# Patient Record
Sex: Male | Born: 1955 | Race: Black or African American | Hispanic: No | Smoking: Current every day smoker
Health system: Southern US, Community
[De-identification: ages and names within clinical notes are randomized; demographics above are authoritative.]

## PROBLEM LIST (undated history)

## (undated) DIAGNOSIS — E119 Type 2 diabetes mellitus without complications: Secondary | ICD-10-CM

## (undated) DIAGNOSIS — I10 Essential (primary) hypertension: Secondary | ICD-10-CM

---

## 2006-08-12 ENCOUNTER — Inpatient Hospital Stay (HOSPITAL_COMMUNITY): Admission: EM | Admit: 2006-08-12 | Discharge: 2006-08-24 | Payer: Self-pay | Admitting: Emergency Medicine

## 2006-09-30 ENCOUNTER — Emergency Department (HOSPITAL_COMMUNITY): Admission: EM | Admit: 2006-09-30 | Discharge: 2006-09-30 | Payer: Self-pay | Admitting: Emergency Medicine

## 2007-11-04 IMAGING — CR DG CHEST 1V PORT
1 series · 1 of 1 positions shown · non-contrast
Comparison: 08/19/06 ? [DATE].

CLINICAL DATA: Followup pneumothorax.  
PORTABLE CHEST ? 1 VIEW ? 08/19/06 ? [DATE]:

[view not recorded]
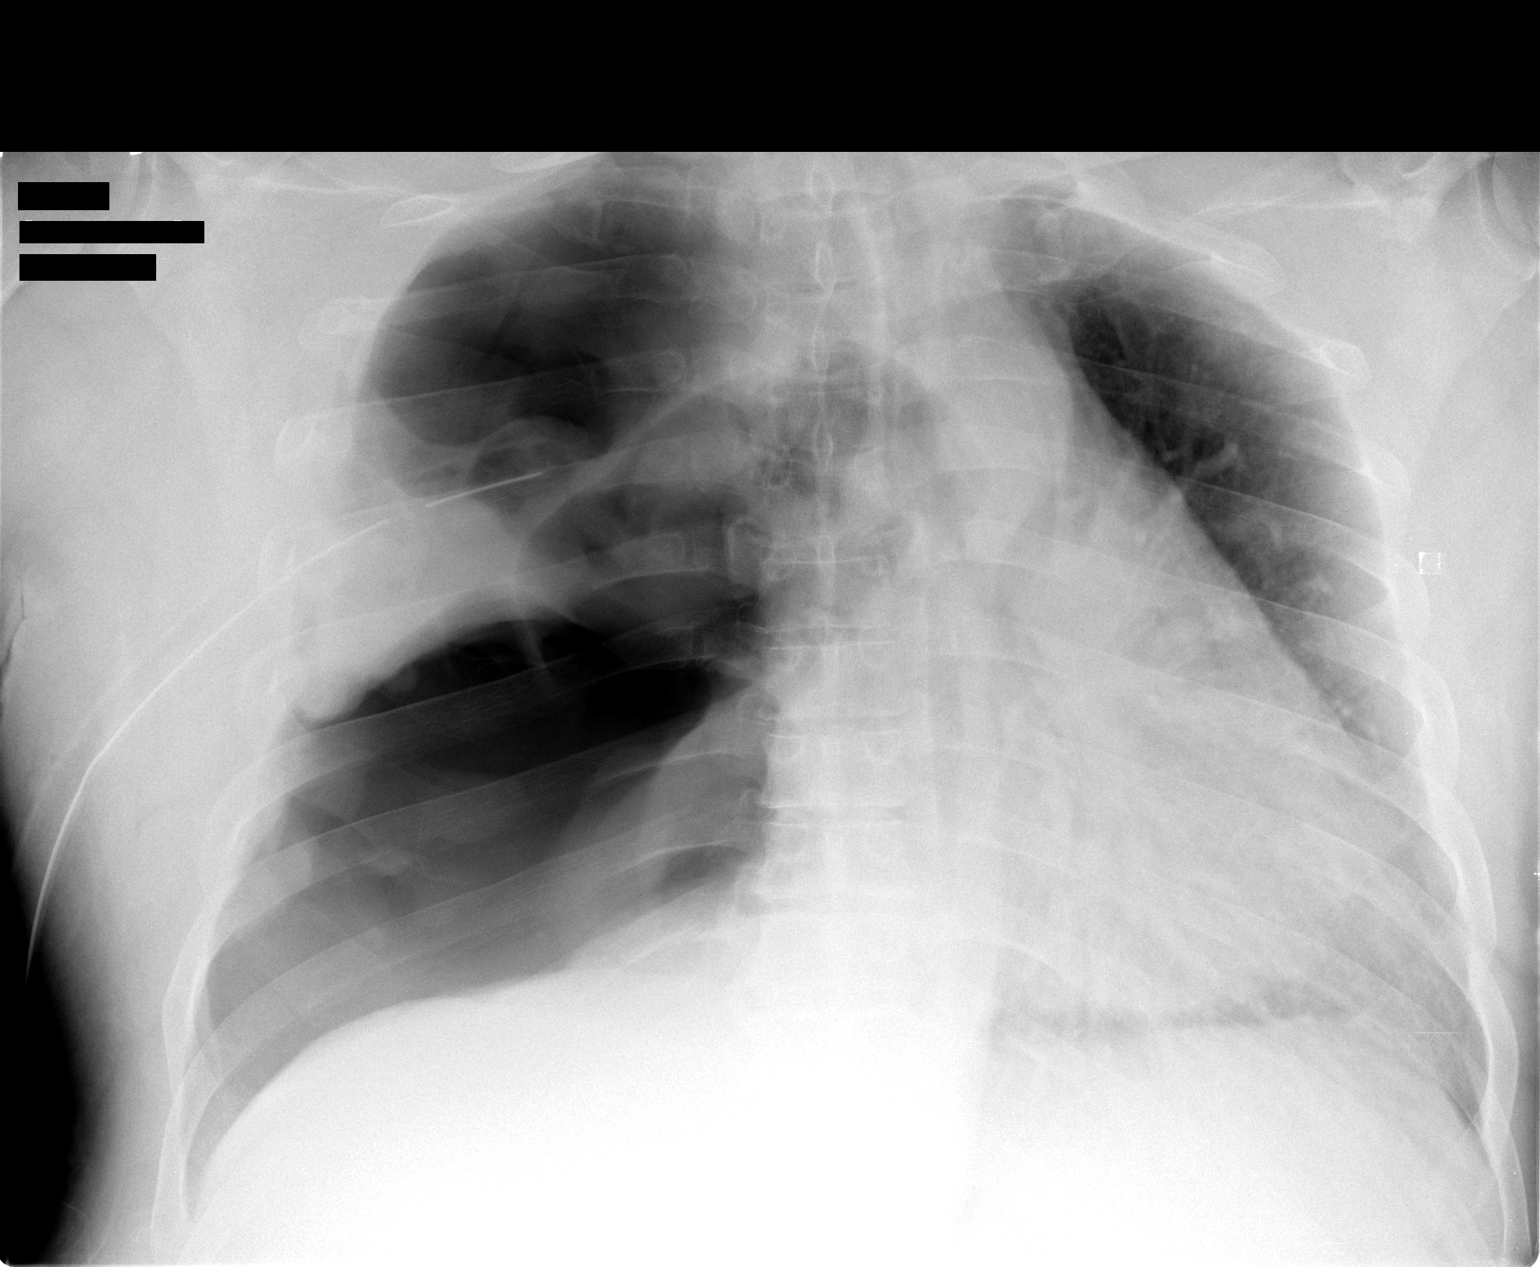

[1 of 1 positions shown; findings below may reference images not displayed]

FINDINGS: Chest tube appears slightly superiorly located and may have been adjusted.  However, there remains a large right-sided pneumothorax which I suspect is under tension, as indicated by mediastinal shift toward the left.  Right lung is completely collapsed.  Possibility of air leak with one-way valve involving the bronchus may contribute to this appearance.  CT imaging may be considered.  esults called by to Dr. Joclo.
IMPRESSION: Persistent right-sided pneumothorax which is suspicious for a tension pneumothorax.  This will require followup as noted above.

## 2007-11-04 IMAGING — CR DG CHEST 1V PORT
1 series · 1 of 1 positions shown · non-contrast
Comparison: 08/18/06.

CLINICAL DATA: Rib fractures.  Chest tube in place; placed on water seal.  
PORTABLE CHEST - 1 VIEW, 3784 HOURS:

[view not recorded]
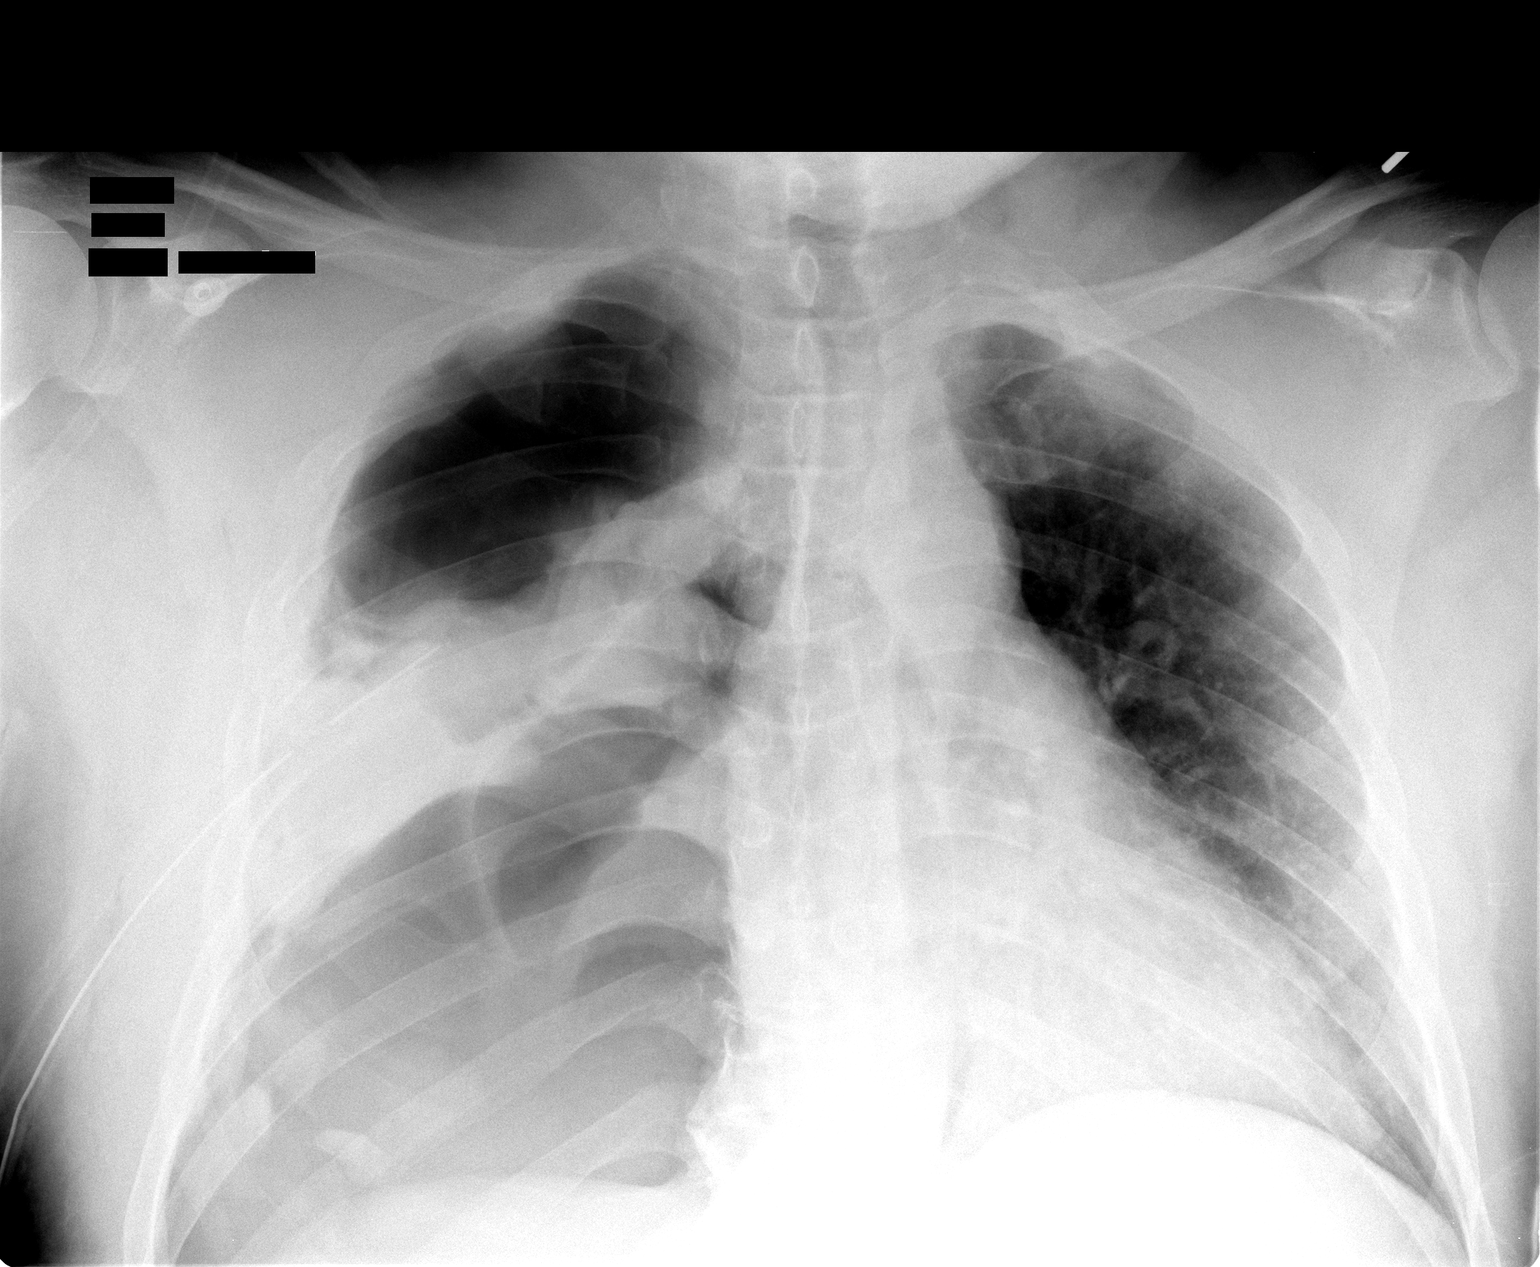

[1 of 1 positions shown; findings below may reference images not displayed]

FINDINGS: Interval development of large right-sided pneumothorax.  I suspect that there is a component that is under tension as indicated by depression of the right hemidiaphragm, shift of the mediastinal structures towards the left and slight shift of the trachea towards the left.  Consolidation mid right lung may represent collapsed lung/hematoma/pleural fluid.  Multiple right rib fractures.  Results discussed with Dr. Berline.  Pulmonary vascular congestion on the left may represent shunting of vasculature.
IMPRESSION: 1.  Interval development of large right-sided pneumothorax, which may have a component which is under tension as described above.  A single right chest tube remains in place.  
2.  Right PICC line has been removed.  Please see above.

## 2015-03-24 ENCOUNTER — Ambulatory Visit (INDEPENDENT_AMBULATORY_CARE_PROVIDER_SITE_OTHER): Payer: Self-pay | Admitting: Emergency Medicine

## 2015-03-24 VITALS — BP 122/88 | HR 87 | Temp 98.6°F | Resp 18 | Ht 69.5 in | Wt 223.0 lb

## 2015-03-24 DIAGNOSIS — I10 Essential (primary) hypertension: Secondary | ICD-10-CM

## 2015-03-24 DIAGNOSIS — E119 Type 2 diabetes mellitus without complications: Secondary | ICD-10-CM | POA: Insufficient documentation

## 2015-03-24 DIAGNOSIS — Z021 Encounter for pre-employment examination: Secondary | ICD-10-CM

## 2015-03-24 DIAGNOSIS — Z024 Encounter for examination for driving license: Secondary | ICD-10-CM

## 2015-03-24 LAB — POCT GLYCOSYLATED HEMOGLOBIN (HGB A1C): Hemoglobin A1C: 6.4

## 2015-03-24 NOTE — Patient Instructions (Signed)

## 2015-03-24 NOTE — Progress Notes (Signed)
Subjective:  Patient ID: Andrew Browning, male    DOB: 04/05/1956  Age: 59 y.o. MRN: [id number]  CC: Annual Exam   HPI Andrew Browning presents  for DOT examination. Does have a history diabetes  History Andrew Browning has no past medical history on file.   He has no past surgical history on file.   His  family history is not on file.  He   reports that he has been smoking.  He does not have any smokeless tobacco history on file. His alcohol and drug histories are not on file.  No outpatient prescriptions prior to visit.   No facility-administered medications prior to visit.    History   Social History  . Marital Status: Widowed    Spouse Name: N/A  . Number of Children: N/A  . Years of Education: N/A   Social History Main Topics  . Smoking status: Current Every Day Smoker  . Smokeless tobacco: Not on file  . Alcohol Use: Not on file  . Drug Use: Not on file  . Sexual Activity: Not on file   Other Topics Concern  . None   Social History Narrative  . None     Review of Systems  Constitutional: Negative for fever, chills and appetite change.  HENT: Negative for congestion, ear pain, postnasal drip, sinus pressure and sore throat.   Eyes: Negative for pain and redness.  Respiratory: Negative for cough, shortness of breath and wheezing.   Cardiovascular: Negative for leg swelling.  Gastrointestinal: Negative for nausea, vomiting, abdominal pain, diarrhea, constipation and blood in stool.  Endocrine: Negative for polyuria.  Genitourinary: Negative for dysuria, urgency, frequency and flank pain.  Musculoskeletal: Negative for gait problem.  Skin: Negative for rash.  Neurological: Negative for weakness and headaches.  Psychiatric/Behavioral: Negative for confusion and decreased concentration. The patient is not nervous/anxious.     Objective:  BP 122/88 mmHg  Pulse 87  Temp(Src) 98.6 F (37 C)  Resp 18  Ht 5' 9.5" (1.765 m)  Wt 223 lb (101.152 kg)  BMI  32.47 kg/m2  SpO2 98%  Physical Exam  Constitutional: He is oriented to person, place, and time. He appears well-developed and well-nourished. No distress.  HENT:  Head: Normocephalic and atraumatic.  Right Ear: External ear normal.  Left Ear: External ear normal.  Nose: Nose normal.  Eyes: Conjunctivae and EOM are normal. Pupils are equal, round, and reactive to light. No scleral icterus.  Neck: Normal range of motion. Neck supple. No tracheal deviation present.  Cardiovascular: Normal rate, regular rhythm and normal heart sounds.   Pulmonary/Chest: Effort normal. No respiratory distress. He has no wheezes. He has no rales.  Abdominal: He exhibits no mass. There is no tenderness. There is no rebound and no guarding.  Musculoskeletal: He exhibits no edema.  Lymphadenopathy:    He has no cervical adenopathy.  Neurological: He is alert and oriented to person, place, and time. Coordination normal.  Skin: Skin is warm and dry. No rash noted.  Psychiatric: He has a normal mood and affect. His behavior is normal.      Assessment & Plan:   Andrew Browning was seen today for annual exam.  Diagnoses and all orders for this visit:  Encounter for commercial driver medical examination (CDME)  Diabetes mellitus without complication Orders: -     POCT glycosylated hemoglobin (Hb A1C)  Essential hypertension   I am having [name] maintain his METFORMIN HCL PO and lisinopril.  Meds ordered this encounter  Medications  .  METFORMIN HCL PO    Sig: Take by mouth.  Marland Kitchen lisinopril (PRINIVIL,ZESTRIL) 5 MG tablet    Sig: Take 5 mg by mouth daily.   DOT card was reviewed and signed for one year.   Appropriate red flag conditions were discussed with the patient as well as actions that should be taken.  Patient expressed his understanding.  Follow-up: Return if symptoms worsen or fail to improve.  Carmelina Dane, MD   Results for orders placed or performed in visit on 03/24/15  POCT  glycosylated hemoglobin (Hb A1C)  Result Value Ref Range   Hemoglobin A1C 6.4

## 2017-06-25 ENCOUNTER — Encounter (HOSPITAL_COMMUNITY): Payer: Self-pay | Admitting: Emergency Medicine

## 2017-06-25 ENCOUNTER — Emergency Department (HOSPITAL_COMMUNITY): Payer: PRIVATE HEALTH INSURANCE

## 2017-06-25 ENCOUNTER — Emergency Department (HOSPITAL_COMMUNITY)
Admission: EM | Admit: 2017-06-25 | Discharge: 2017-06-25 | Disposition: A | Discharge status: Home / Self Care | Payer: PRIVATE HEALTH INSURANCE | Attending: Emergency Medicine | Admitting: Emergency Medicine

## 2017-06-25 DIAGNOSIS — R070 Pain in throat: Secondary | ICD-10-CM | POA: Diagnosis present

## 2017-06-25 DIAGNOSIS — F17200 Nicotine dependence, unspecified, uncomplicated: Secondary | ICD-10-CM | POA: Diagnosis not present

## 2017-06-25 DIAGNOSIS — Z7984 Long term (current) use of oral hypoglycemic drugs: Secondary | ICD-10-CM | POA: Diagnosis not present

## 2017-06-25 DIAGNOSIS — J40 Bronchitis, not specified as acute or chronic: Secondary | ICD-10-CM | POA: Insufficient documentation

## 2017-06-25 DIAGNOSIS — R197 Diarrhea, unspecified: Secondary | ICD-10-CM | POA: Insufficient documentation

## 2017-06-25 DIAGNOSIS — I10 Essential (primary) hypertension: Secondary | ICD-10-CM | POA: Insufficient documentation

## 2017-06-25 DIAGNOSIS — E119 Type 2 diabetes mellitus without complications: Secondary | ICD-10-CM | POA: Insufficient documentation

## 2017-06-25 DIAGNOSIS — Z79899 Other long term (current) drug therapy: Secondary | ICD-10-CM | POA: Insufficient documentation

## 2017-06-25 HISTORY — DX: Type 2 diabetes mellitus without complications: E11.9

## 2017-06-25 HISTORY — DX: Essential (primary) hypertension: I10

## 2017-06-25 LAB — RAPID STREP SCREEN (MED CTR MEBANE ONLY): STREPTOCOCCUS, GROUP A SCREEN (DIRECT): NEGATIVE

## 2017-06-25 MED ORDER — AZITHROMYCIN 250 MG PO TABS: 250.0000 mg | ORAL_TABLET | Freq: Every day | ORAL | 0 refills | Status: AC

## 2017-06-25 NOTE — ED Triage Notes (Signed)
Patient here with two day history of sore throat.  He has been breaking out in sweats off and on in the last few days also.  Patient having trouble swallowing, some shortness of breath.  Patient states that he has not been checking his sugar.

## 2017-06-25 NOTE — ED Provider Notes (Signed)
[facility]-EMERGENCY DEPT Provider Note   CSN: [id number] Arrival date & time: 06/25/17  0021     History   Chief Complaint Chief Complaint  Patient presents with  . Sore Throat    HPI Andrew Browning is a 61 y.o. male.  Patient reports that he has been experiencing fever, chills, sweats several days. He started with a sore throat which continues, but now he also has developed a cough. He has had diarrhea, no nausea or vomiting. There is no associated abdominal pain. He has not had shortness of breath.      Past Medical History:  Diagnosis Date  . Diabetes mellitus without complication (HCC)   . Hypertension     Patient Active Problem List   Diagnosis Date Noted  . Diabetes mellitus without complication (HCC) 03/24/2015  . Essential hypertension 03/24/2015    History reviewed. No pertinent surgical history.     Home Medications    Prior to Admission medications   Medication Sig Start Date End Date Taking? Authorizing Provider  lisinopril (PRINIVIL,ZESTRIL) 5 MG tablet Take 5 mg by mouth daily.   Yes [provider]  metFORMIN (GLUCOPHAGE) 500 MG tablet Take 500 mg by mouth daily with breakfast.   Yes [provider]  azithromycin (ZITHROMAX) 250 MG tablet Take 1 tablet (250 mg total) by mouth daily. Take first 2 tablets together, then 1 every day until finished. 06/25/17   Gilda Crease, MD    Family History No family history on file.  Social History Social History  Substance Use Topics  . Smoking status: Current Every Day Smoker  . Smokeless tobacco: Not on file  . Alcohol use Not on file     Allergies   Patient has no known allergies.   Review of Systems Review of Systems  Constitutional: Positive for chills, diaphoresis and fever.  HENT: Positive for sore throat.   Respiratory: Positive for cough.   Gastrointestinal: Positive for diarrhea.  All other systems reviewed and are negative.    Physical Exam Updated Vital  Signs BP (!) 149/102   Pulse 100   Temp 99 F (37.2 C) (Oral)   Resp 18   SpO2 96%   Physical Exam  Constitutional: He is oriented to person, place, and time. He appears well-developed and well-nourished. No distress.  HENT:  Head: Normocephalic and atraumatic.  Right Ear: Hearing normal.  Left Ear: Hearing normal.  Nose: Nose normal.  Mouth/Throat: Mucous membranes are normal. Posterior oropharyngeal erythema present. No oropharyngeal exudate.  Eyes: Pupils are equal, round, and reactive to light. Conjunctivae and EOM are normal.  Neck: Normal range of motion. Neck supple.  Cardiovascular: Regular rhythm, S1 normal and S2 normal.  Exam reveals no gallop and no friction rub.   No murmur heard. Pulmonary/Chest: Effort normal and breath sounds normal. No respiratory distress. He exhibits no tenderness.  Abdominal: Soft. Normal appearance and bowel sounds are normal. There is no hepatosplenomegaly. There is no tenderness. There is no rebound, no guarding, no tenderness at McBurney's point and negative Murphy's sign. No hernia.  Musculoskeletal: Normal range of motion.  Neurological: He is alert and oriented to person, place, and time. He has normal strength. No cranial nerve deficit or sensory deficit. Coordination normal. GCS eye subscore is 4. GCS verbal subscore is 5. GCS motor subscore is 6.  Skin: Skin is warm, dry and intact. No rash noted. No cyanosis.  Psychiatric: He has a normal mood and affect. His speech is normal and behavior is normal.  Thought content normal.  Nursing note and vitals reviewed.    ED Treatments / Results  Labs (all labs ordered are listed, but only abnormal results are displayed) Labs Reviewed  RAPID STREP SCREEN (NOT AT [facility])  CULTURE, GROUP A STREP [facility])    EKG  EKG Interpretation None       Radiology Dg Chest 2 View  Result Date: 06/25/2017 CLINICAL DATA:  Initial evaluation for acute cough, fever. EXAM: CHEST  2 VIEW COMPARISON:  Prior  radiograph from 08/24/2006. FINDINGS: Cardiac and mediastinal silhouettes are within normal limits. Mild elevation the right hemidiaphragm. Blunting of the right costophrenic angle suspected to reflect chronic pleural reaction/scarring, although a small effusion not entirely excluded. Probable associated mild right basilar scarring. No pulmonary edema. No other focal infiltrates. No pneumothorax. No acute osseus abnormality. IMPRESSION: 1. Blunting of the right costophrenic angle, suspected to reflect chronic pleural reaction/scarring, although a tiny effusion could also be considered. 2. Associated mild right basilar atelectasis and/or scarring. 3. No other active cardiopulmonary disease. Electronically Signed   By: Rise Mu M.D.   On: 06/25/2017 04:32    Procedures Procedures (including critical care time)  Medications Ordered in ED Medications - No data to display   Initial Impression / Assessment and Plan / ED Course  I have reviewed the triage vital signs and the nursing notes.  Pertinent labs & imaging results that were available during my care of the patient were reviewed by me and considered in my medical decision making (see chart for details).     Patient has been experiencing fever, chills, sweats, sore throat and cough for several days. He appears well. He has not been experiencing any abdominal pain, abdominal exam is benign. He did have an episode of diarrhea, non-bloody. No nausea or vomiting. Patient does have erythema and slight edema of the oropharynx, no exudate. Rapid strep negative. Culture pending. Patient complaining predominately of cough and chest congestion currently. Chest x-ray does not show obvious pneumonia.  Final Clinical Impressions(s) / ED Diagnoses   Final diagnoses:  Bronchitis    New Prescriptions New Prescriptions   AZITHROMYCIN (ZITHROMAX) 250 MG TABLET    Take 1 tablet (250 mg total) by mouth daily. Take first 2 tablets together, then 1  every day until finished.     Gilda Crease, MD 06/25/17 [phone]

## 2017-06-27 LAB — CULTURE, GROUP A STREP (THRC)

## 2018-10-15 ENCOUNTER — Emergency Department (HOSPITAL_COMMUNITY): Payer: Non-veteran care

## 2018-10-15 ENCOUNTER — Emergency Department (HOSPITAL_COMMUNITY)
Admission: EM | Admit: 2018-10-15 | Discharge: 2018-10-15 | Disposition: A | Discharge status: Home / Self Care | Payer: Non-veteran care | Attending: Emergency Medicine | Admitting: Emergency Medicine

## 2018-10-15 ENCOUNTER — Encounter (HOSPITAL_COMMUNITY): Payer: Self-pay

## 2018-10-15 DIAGNOSIS — Z23 Encounter for immunization: Secondary | ICD-10-CM | POA: Insufficient documentation

## 2018-10-15 DIAGNOSIS — Z7984 Long term (current) use of oral hypoglycemic drugs: Secondary | ICD-10-CM | POA: Insufficient documentation

## 2018-10-15 DIAGNOSIS — W19XXXA Unspecified fall, initial encounter: Secondary | ICD-10-CM | POA: Insufficient documentation

## 2018-10-15 DIAGNOSIS — E119 Type 2 diabetes mellitus without complications: Secondary | ICD-10-CM | POA: Insufficient documentation

## 2018-10-15 DIAGNOSIS — Y929 Unspecified place or not applicable: Secondary | ICD-10-CM | POA: Insufficient documentation

## 2018-10-15 DIAGNOSIS — F17200 Nicotine dependence, unspecified, uncomplicated: Secondary | ICD-10-CM | POA: Insufficient documentation

## 2018-10-15 DIAGNOSIS — Y999 Unspecified external cause status: Secondary | ICD-10-CM | POA: Insufficient documentation

## 2018-10-15 DIAGNOSIS — I10 Essential (primary) hypertension: Secondary | ICD-10-CM | POA: Insufficient documentation

## 2018-10-15 DIAGNOSIS — Z79899 Other long term (current) drug therapy: Secondary | ICD-10-CM | POA: Insufficient documentation

## 2018-10-15 DIAGNOSIS — S0990XA Unspecified injury of head, initial encounter: Secondary | ICD-10-CM

## 2018-10-15 DIAGNOSIS — S0101XA Laceration without foreign body of scalp, initial encounter: Secondary | ICD-10-CM

## 2018-10-15 DIAGNOSIS — Y9389 Activity, other specified: Secondary | ICD-10-CM | POA: Insufficient documentation

## 2018-10-15 DIAGNOSIS — S0181XA Laceration without foreign body of other part of head, initial encounter: Secondary | ICD-10-CM | POA: Insufficient documentation

## 2018-10-15 LAB — CBC WITH DIFFERENTIAL/PLATELET
ABS IMMATURE GRANULOCYTES: 0.02 10*3/uL (ref 0.00–0.07)
BASOS ABS: 0.1 10*3/uL (ref 0.0–0.1)
BASOS PCT: 1 %
Eosinophils Absolute: 0.1 10*3/uL (ref 0.0–0.5)
Eosinophils Relative: 1 %
HCT: 35.6 % — ABNORMAL LOW (ref 39.0–52.0)
Hemoglobin: 11.8 g/dL — ABNORMAL LOW (ref 13.0–17.0)
Immature Granulocytes: 0 %
Lymphocytes Relative: 32 %
Lymphs Abs: 1.9 10*3/uL (ref 0.7–4.0)
MCH: 30.1 pg (ref 26.0–34.0)
MCHC: 33.1 g/dL (ref 30.0–36.0)
MCV: 90.8 fL (ref 80.0–100.0)
Monocytes Absolute: 0.4 10*3/uL (ref 0.1–1.0)
Monocytes Relative: 6 %
NEUTROS ABS: 3.5 10*3/uL (ref 1.7–7.7)
NEUTROS PCT: 60 %
PLATELETS: 387 10*3/uL (ref 150–400)
RBC: 3.92 MIL/uL — AB (ref 4.22–5.81)
RDW: 14.4 % (ref 11.5–15.5)
WBC: 5.9 10*3/uL (ref 4.0–10.5)
nRBC: 0 % (ref 0.0–0.2)

## 2018-10-15 MED ORDER — TETANUS-DIPHTHERIA TOXOIDS TD 5-2 LFU IM INJ: 0.5000 mL | INJECTION | Freq: Once | INTRAMUSCULAR | Status: DC

## 2018-10-15 MED ORDER — TETANUS-DIPHTH-ACELL PERTUSSIS 5-2.5-18.5 LF-MCG/0.5 IM SUSP
0.5000 mL | Freq: Once | INTRAMUSCULAR | Status: AC
  Administered 2018-10-15: 0.5 mL via INTRAMUSCULAR
  Filled 2018-10-15: qty 0.5

## 2018-10-15 MED ORDER — LIDOCAINE-EPINEPHRINE (PF) 2 %-1:200000 IJ SOLN
10.0000 mL | Freq: Once | INTRAMUSCULAR | Status: AC
  Administered 2018-10-15: 10 mL
  Filled 2018-10-15: qty 20

## 2018-10-15 NOTE — ED Provider Notes (Signed)
[facility] Provider Note   CSN: [id number] Arrival date & time: 10/15/18  [id number]     History   Chief Complaint Chief Complaint  Patient presents with  . Fall  . Head Laceration    HPI Andrew Browning is a 63 y.o. male.  63 year old male with prior medical history as detailed below presents for evaluation of scalp lacerations.  Patient reports that he had heavy alcohol consumption last night.  He went home and went to bed.  He woke up this morning and is scalp and face and chest were covered in blood.  He does not recall falling.  He cannot tell me exactly how he sustained to scalp lacerations.  He is unsure of his last tetanus.  He denies other injury or complaint at this time.  EMS reports significant blood loss on scene.  The history is provided by the patient and medical records.  Fall  This is a new problem. The current episode started 6 to 12 hours ago. The problem occurs constantly. The problem has not changed since onset.Pertinent negatives include no chest pain, no abdominal pain, no headaches and no shortness of breath. Nothing aggravates the symptoms. Nothing relieves the symptoms.  Head Laceration  Pertinent negatives include no chest pain, no abdominal pain, no headaches and no shortness of breath.    Past Medical History:  Diagnosis Date  . Diabetes mellitus without complication (HCC)   . Hypertension     Patient Active Problem List   Diagnosis Date Noted  . Diabetes mellitus without complication (HCC) 03/24/2015  . Essential hypertension 03/24/2015    History reviewed. No pertinent surgical history.      Home Medications    Prior to Admission medications   Medication Sig Start Date End Date Taking? Authorizing Provider  azithromycin (ZITHROMAX) 250 MG tablet Take 1 tablet (250 mg total) by mouth daily. Take first 2 tablets together, then 1 every day until finished. 06/25/17   Gilda CreasePollina, Christopher J, MD    lisinopril (PRINIVIL,ZESTRIL) 5 MG tablet Take 5 mg by mouth daily.    [provider]  metFORMIN (GLUCOPHAGE) 500 MG tablet Take 500 mg by mouth daily with breakfast.    [provider]    Family History No family history on file.  Social History Social History   Tobacco Use  . Smoking status: Current Every Day Smoker  Substance Use Topics  . Alcohol use: Not on file  . Drug use: Not on file     Allergies   Patient has no known allergies.   Review of Systems Review of Systems  Respiratory: Negative for shortness of breath.   Cardiovascular: Negative for chest pain.  Gastrointestinal: Negative for abdominal pain.  Neurological: Negative for headaches.  All other systems reviewed and are negative.    Physical Exam Updated Vital Signs Temp 97.9 F (36.6 C) (Oral)   Physical Exam Vitals signs and nursing note reviewed.  Constitutional:      General: He is not in acute distress.    Appearance: He is well-developed.  HENT:     Head: Normocephalic.     Comments: 2 linear scalp lacerations to the right scalp.  One is anterior and over the right forehead.  This is approximately 2 cm in length.  Laceration #2 is on the posterior right scalp.  Is approximate 1 cm in length.    Right Ear: Tympanic membrane normal.     Left Ear: Tympanic membrane normal.  Nose: Nose normal.     Mouth/Throat:     Mouth: Mucous membranes are moist.  Eyes:     Conjunctiva/sclera: Conjunctivae normal.     Pupils: Pupils are equal, round, and reactive to light.  Neck:     Musculoskeletal: Normal range of motion and neck supple.  Cardiovascular:     Rate and Rhythm: Normal rate and regular rhythm.     Heart sounds: Normal heart sounds.  Pulmonary:     Effort: Pulmonary effort is normal. No respiratory distress.     Breath sounds: Normal breath sounds.  Abdominal:     General: There is no distension.     Palpations: Abdomen is soft.     Tenderness: There is no  abdominal tenderness.  Musculoskeletal: Normal range of motion.        General: No deformity.  Skin:    General: Skin is warm and dry.  Neurological:     General: No focal deficit present.     Mental Status: He is alert and oriented to person, place, and time. Mental status is at baseline.      ED Treatments / Results  Labs (all labs ordered are listed, but only abnormal results are displayed) Labs Reviewed  CBC WITH DIFFERENTIAL/PLATELET - Abnormal; Notable for the following components:      Result Value   RBC 3.92 (*)    Hemoglobin 11.8 (*)    HCT 35.6 (*)    All other components within normal limits    EKG None  Radiology Ct Head Wo Contrast  Result Date: 10/15/2018 CLINICAL DATA:  Head trauma, history of prior cervical spine fracture EXAM: CT HEAD WITHOUT CONTRAST CT CERVICAL SPINE WITHOUT CONTRAST TECHNIQUE: Multidetector CT imaging of the head and cervical spine was performed following the standard protocol without intravenous contrast. Multiplanar CT image reconstructions of the cervical spine were also generated. COMPARISON:  08/13/2006 CT head, 08/12/2006 CT cervical spine FINDINGS: CT HEAD FINDINGS Brain: Generalized atrophy. Normal ventricular morphology. No midline shift or mass effect. Small vessel chronic ischemic changes of deep cerebral white matter. No intracranial hemorrhage, mass lesion, evidence of acute infarction, or extra-axial fluid collection. Scattered dural calcifications within falx. Vascular: Unremarkable Skull: Calvaria intact. Small RIGHT frontal scalp laceration with foci of subcutaneous gas. Sinuses/Orbits: Clear Other: N/A CT CERVICAL SPINE FINDINGS Alignment: Normal Skull base and vertebrae: Disc space narrowing with endplate spur formation at C5-C6 through T1-T2. Additional tiny endplate spurs at Z6-X0 anteriorly. Uncovertebral spurs encroach upon C6-C7 neural foramina bilaterally, with similar but less severe findings at T1-T2 and LEFT C5-C6.  Scattered multilevel facet degenerative changes. Vertebral body heights maintained. No fracture, subluxation or bone destruction. Previously identified odontoid fracture appears healed. Visualized skull base intact. Soft tissues and spinal canal: Prevertebral soft tissues normal thickness. Cervical soft tissues otherwise unremarkable. Disc levels:  No additional abnormalities Upper chest: Lung apices clear Other: N/A IMPRESSION: Atrophy with small vessel chronic ischemic changes of deep cerebral white matter. No acute intracranial abnormalities. 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 Interval healing of prior odontoid fracture. No acute cervical spine abnormalities. Electronically Signed   By: Ulyses Southward M.D.   On: 10/15/2018 11:49   Ct Cervical Spine Wo Contrast  Result Date: 10/15/2018 CLINICAL DATA:  Head trauma, history of prior cervical spine fracture EXAM: CT HEAD WITHOUT CONTRAST CT CERVICAL SPINE WITHOUT CONTRAST TECHNIQUE: Multidetector CT imaging of the head and cervical spine was performed following the standard protocol without intravenous contrast. Multiplanar CT image  reconstructions of the cervical spine were also generated. COMPARISON:  08/13/2006 CT head, 08/12/2006 CT cervical spine FINDINGS: CT HEAD FINDINGS Brain: Generalized atrophy. Normal ventricular morphology. No midline shift or mass effect. Small vessel chronic ischemic changes of deep cerebral white matter. No intracranial hemorrhage, mass lesion, evidence of acute infarction, or extra-axial fluid collection. Scattered dural calcifications within falx. Vascular: Unremarkable Skull: Calvaria intact. Small RIGHT frontal scalp laceration with foci of subcutaneous gas. Sinuses/Orbits: Clear Other: N/A CT CERVICAL SPINE FINDINGS Alignment: Normal Skull base and vertebrae: Disc space narrowing with endplate spur formation at C5-C6 through T1-T2. Additional tiny endplate spurs at W3-S9 anteriorly. Uncovertebral spurs  encroach upon C6-C7 neural foramina bilaterally, with similar but less severe findings at T1-T2 and LEFT C5-C6. Scattered multilevel facet degenerative changes. Vertebral body heights maintained. No fracture, subluxation or bone destruction. Previously identified odontoid fracture appears healed. Visualized skull base intact. Soft tissues and spinal canal: Prevertebral soft tissues normal thickness. Cervical soft tissues otherwise unremarkable. Disc levels:  No additional abnormalities Upper chest: Lung apices clear Other: N/A IMPRESSION: Atrophy with small vessel chronic ischemic changes of deep cerebral white matter. No acute intracranial abnormalities. 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 Interval healing of prior odontoid fracture. No acute cervical spine abnormalities. Electronically Signed   By: Ulyses Southward M.D.   On: 10/15/2018 11:49    Procedures .Marland KitchenLaceration Repair Date/Time: 10/15/2018 12:19 PM Performed by: Wynetta Fines, MD Authorized by: Wynetta Fines, MD   Consent:    Consent obtained:  Verbal   Consent given by:  Patient   Risks discussed:  Infection, pain, need for additional repair, poor cosmetic result, poor wound healing, nerve damage, retained foreign body, tendon damage and vascular damage   Alternatives discussed:  No treatment Anesthesia (see MAR for exact dosages):    Anesthesia method:  Local infiltration   Local anesthetic:  Lidocaine 2% WITH epi Laceration details:    Location:  Scalp   Scalp location:  Frontal   Length (cm):  2 Repair type:    Repair type:  Simple Pre-procedure details:    Preparation:  Patient was prepped and draped in usual sterile fashion Exploration:    Hemostasis achieved with:  Tied off vessels and direct pressure   Wound exploration: wound explored through full range of motion     Wound extent: no foreign bodies/material noted     Contaminated: no   Treatment:    Area cleansed with:  Betadine and  saline   Amount of cleaning:  Standard   Irrigation solution:  Sterile saline   Irrigation method:  Syringe   Visualized foreign bodies/material removed: no   Skin repair:    Repair method:  Sutures   Suture size:  4-0   Suture material:  Nylon   Suture technique:  Running Approximation:    Approximation:  Close Post-procedure details:    Dressing:  Open (no dressing)   Patient tolerance of procedure:  Tolerated well, no immediate complications .Marland KitchenLaceration Repair Date/Time: 10/15/2018 12:20 PM Performed by: Wynetta Fines, MD Authorized by: Wynetta Fines, MD   Consent:    Consent obtained:  Verbal   Consent given by:  Patient   Risks discussed:  Infection, need for additional repair, nerve damage, pain, poor cosmetic result, poor wound healing, tendon damage, vascular damage and retained foreign body   Alternatives discussed:  No treatment Anesthesia (see MAR for exact dosages):    Anesthesia method:  Topical application and local infiltration  Local anesthetic:  Lidocaine 2% WITH epi Laceration details:    Location:  Scalp   Scalp location:  R parietal   Length (cm):  1 Repair type:    Repair type:  Simple Pre-procedure details:    Preparation:  Patient was prepped and draped in usual sterile fashion Exploration:    Hemostasis achieved with:  Epinephrine and direct pressure   Wound exploration: wound explored through full range of motion     Wound extent: no foreign bodies/material noted   Treatment:    Area cleansed with:  Betadine and saline   Amount of cleaning:  Standard   Irrigation solution:  Sterile saline   Visualized foreign bodies/material removed: no   Skin repair:    Repair method:  Sutures   Suture size:  4-0   Suture material:  Nylon   Suture technique:  Running Approximation:    Approximation:  Close Post-procedure details:    Dressing:  Open (no dressing)   Patient tolerance of procedure:  Tolerated well, no immediate complications    (including critical care time)  Medications Ordered in ED Medications  lidocaine-EPINEPHrine (XYLOCAINE W/EPI) 2 %-1:200000 (PF) injection 10 mL (10 mLs Infiltration Given by Other 10/15/18 0926)  Tdap (BOOSTRIX) injection 0.5 mL (0.5 mLs Intramuscular Given 10/15/18 0924)     Initial Impression / Assessment and Plan / ED Course  I have reviewed the triage vital signs and the nursing notes.  Pertinent labs & imaging results that were available during my care of the patient were reviewed by me and considered in my medical decision making (see chart for details).     MDM  Screen complete  Patient is presenting for evaluation of scalp laceration.  His injuries appear to have occurred while he was intoxicated.  He cannot recall the exact events leading to the injury.  Lacerations were repaired without difficulty.  CT imaging did not suggest other intracranial injury.  Patient was placed into the hallway bed and decided to leave prior to discharge.  At time of his elopement from the facility he was alert and oriented and ambulating with a steady gait.  He did not receive discharge instructions since he left prior to discharge.  Final Clinical Impressions(s) / ED Diagnoses   Final diagnoses:  Injury of head, initial encounter  Laceration of scalp, initial encounter    ED Discharge Orders    None       Wynetta Fines, MD 10/15/18 1221

## 2018-10-15 NOTE — Discharge Instructions (Signed)
Return for any problem.  Follow-up with your regular care provider as instructed.  Sutures in your scalp should be removed in 5 to 7 days.  You can return to this facility for removal of your sutures.

## 2018-10-15 NOTE — ED Triage Notes (Signed)
Pt presents for evaluation of laceration above R eye and posterior head. Pt fell on unknown object last night, does not recall event. Woke up in pool of blood. Bleeding controlled at this time. No blood thinner. AxO x4.

## 2018-10-25 ENCOUNTER — Emergency Department (HOSPITAL_COMMUNITY)
Admission: EM | Admit: 2018-10-25 | Discharge: 2018-10-25 | Disposition: A | Discharge status: Home / Self Care | Payer: Non-veteran care | Attending: Emergency Medicine | Admitting: Emergency Medicine

## 2018-10-25 DIAGNOSIS — F17200 Nicotine dependence, unspecified, uncomplicated: Secondary | ICD-10-CM | POA: Insufficient documentation

## 2018-10-25 DIAGNOSIS — S0101XD Laceration without foreign body of scalp, subsequent encounter: Secondary | ICD-10-CM | POA: Insufficient documentation

## 2018-10-25 DIAGNOSIS — I10 Essential (primary) hypertension: Secondary | ICD-10-CM | POA: Insufficient documentation

## 2018-10-25 DIAGNOSIS — E119 Type 2 diabetes mellitus without complications: Secondary | ICD-10-CM | POA: Insufficient documentation

## 2018-10-25 DIAGNOSIS — Y33XXXD Other specified events, undetermined intent, subsequent encounter: Secondary | ICD-10-CM | POA: Insufficient documentation

## 2018-10-25 DIAGNOSIS — Z7984 Long term (current) use of oral hypoglycemic drugs: Secondary | ICD-10-CM | POA: Insufficient documentation

## 2018-10-25 DIAGNOSIS — Z79899 Other long term (current) drug therapy: Secondary | ICD-10-CM | POA: Insufficient documentation

## 2018-10-25 DIAGNOSIS — Z4802 Encounter for removal of sutures: Secondary | ICD-10-CM | POA: Insufficient documentation

## 2018-10-25 NOTE — ED Triage Notes (Signed)
Pt here for suture removal, stitches to right side of head. nad noted

## 2018-10-25 NOTE — ED Provider Notes (Signed)
[facility] EMERGENCY DEPARTMENT Provider Note   CSN: [id number] Arrival date & time: 10/25/18  1317     History   Chief Complaint Chief Complaint  Patient presents with  . Suture / Staple Removal    HPI Andrew Browning is a 63 y.o. male.  HPI   Patient is a 63 year old male history of diabetes, hypertension, who presents the emergency department today for suture removal.  Patient had a fall and sustained 2 lacerations on 10/15/2018.  He had sutures placed at that time and was advised to return to the ED In the next 5 to 7 days.  He returns today for suture removal.  He denies fevers or chills.  No swelling, redness or drainage from the wounds.  No continued pain to the wounds.  Past Medical History:  Diagnosis Date  . Diabetes mellitus without complication (HCC)   . Hypertension     Patient Active Problem List   Diagnosis Date Noted  . Diabetes mellitus without complication (HCC) 03/24/2015  . Essential hypertension 03/24/2015    No past surgical history on file.      Home Medications    Prior to Admission medications   Medication Sig Start Date End Date Taking? Authorizing Provider  azithromycin (ZITHROMAX) 250 MG tablet Take 1 tablet (250 mg total) by mouth daily. Take first 2 tablets together, then 1 every day until finished. 06/25/17   Gilda Crease, MD  lisinopril (PRINIVIL,ZESTRIL) 5 MG tablet Take 5 mg by mouth daily.    [provider]  metFORMIN (GLUCOPHAGE) 500 MG tablet Take 500 mg by mouth daily with breakfast.    [provider]    Family History No family history on file.  Social History Social History   Tobacco Use  . Smoking status: Current Every Day Smoker  Substance Use Topics  . Alcohol use: Not on file  . Drug use: Not on file     Allergies   Patient has no known allergies.   Review of Systems Review of Systems  Constitutional: Negative for fever.  Skin: Positive for wound.   Suture present     Physical Exam Updated Vital Signs BP (!) 151/109   Pulse (!) 112   Temp 98.4 F (36.9 C) (Oral)   Resp 14   SpO2 99%   Physical Exam Constitutional:      General: He is not in acute distress.    Appearance: He is well-developed.  HENT:     Head:     Comments: Two well healing wounds to the right lateral and right posterior scalp. No erythema, warmth, induration or fluctuance. No significant ttp. Eyes:     Conjunctiva/sclera: Conjunctivae normal.  Cardiovascular:     Rate and Rhythm: Normal rate and regular rhythm.  Pulmonary:     Effort: Pulmonary effort is normal.     Breath sounds: Normal breath sounds.  Skin:    General: Skin is warm and dry.  Neurological:     Mental Status: He is alert and oriented to person, place, and time.      ED Treatments / Results  Labs (all labs ordered are listed, but only abnormal results are displayed) Labs Reviewed - No data to display  EKG None  Radiology No results found.  Procedures Procedures (including critical care time)  Medications Ordered in ED Medications - No data to display   Initial Impression / Assessment and Plan / ED Course  I have reviewed the triage vital signs and  the nursing notes.  Pertinent labs & imaging results that were available during my care of the patient were reviewed by me and considered in my medical decision making (see chart for details).     Final Clinical Impressions(s) / ED Diagnoses   Final diagnoses:  Visit for suture removal    Pt to ER for staple/suture removal and wound check as above. Procedure tolerated well. Vitals normal, no signs of infection. Scar minimization & return precautions given at dc.    ED Discharge Orders    None       Rayne Du 10/25/18 1408    Eber Hong, MD 10/28/18 [phone]

## 2018-10-25 NOTE — Discharge Instructions (Addendum)
° °  Please return to the emergency room immediately if you experience any new or worsening symptoms or any symptoms that indicate worsening infection such as fevers, increased redness/swelling/pain, warmth, or drainage from the affected area.   

## 2019-12-31 IMAGING — CT CT CERVICAL SPINE W/O CM
3 of 4 series · 12 of 33 positions shown, 14 images · non-contrast
Comparison: 08/13/2006 CT head, 08/12/2006 CT cervical spine

CLINICAL DATA: Head trauma, history of prior cervical spine
fracture

EXAM:
CT HEAD WITHOUT CONTRAST
CT CERVICAL SPINE WITHOUT CONTRAST
TECHNIQUE: Multidetector CT imaging of the head and cervical spine was
performed following the standard protocol without intravenous
contrast. Multiplanar CT image reconstructions of the cervical spine
were also generated.

[Series 4: c_spine 2.0 st · axial · 0.35mm/px · z∈[-323,-167]mm · 4 of 110 slices shown, 5 images]
[im 16/110  soft-tissue]
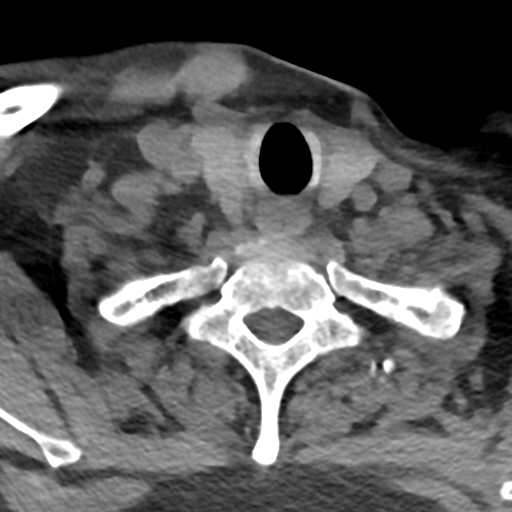
[im 16/110  bone]
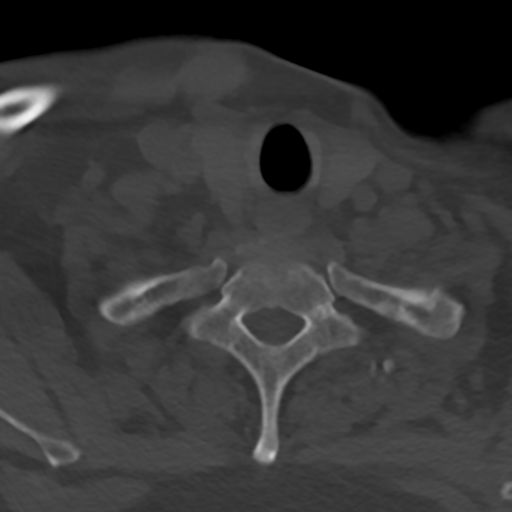
[im 47/110  bone]
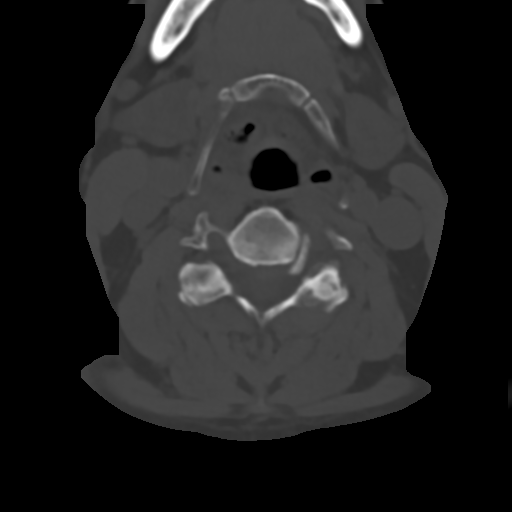
[im 63/110  bone]
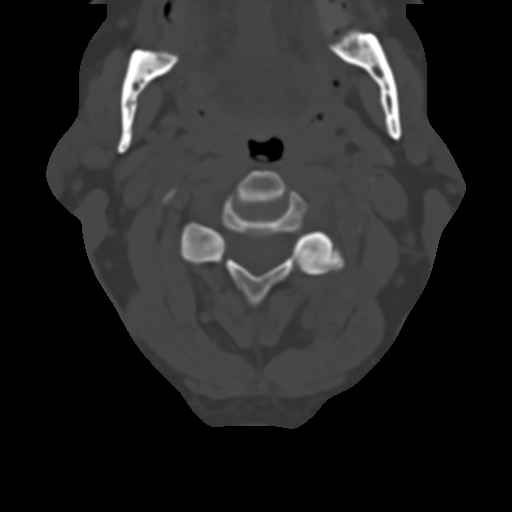
[im 94/110  bone]
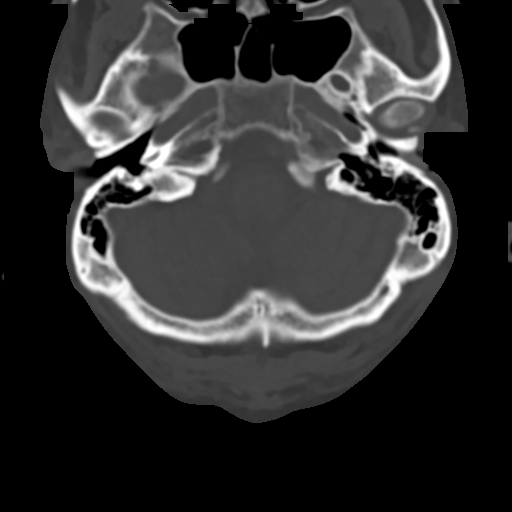

[Series 6: c_spine 2.0 sag bone · sagittal · 0.40mm/px · 5 of 61 slices shown, 6 images]
[im 21/61  bone]
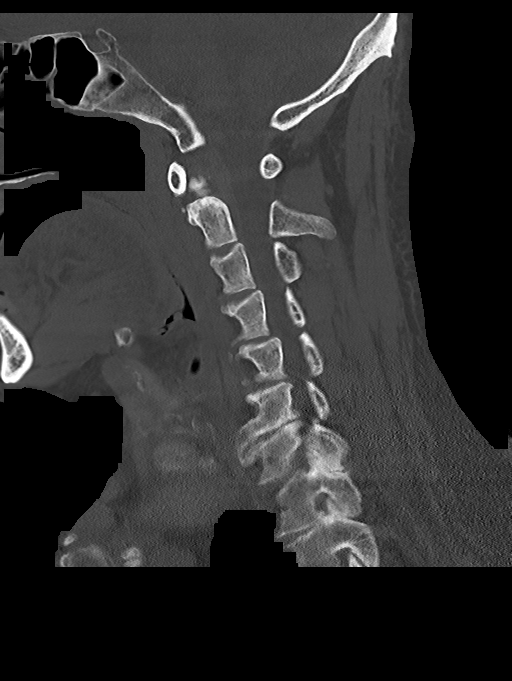
[im 26/61  bone]
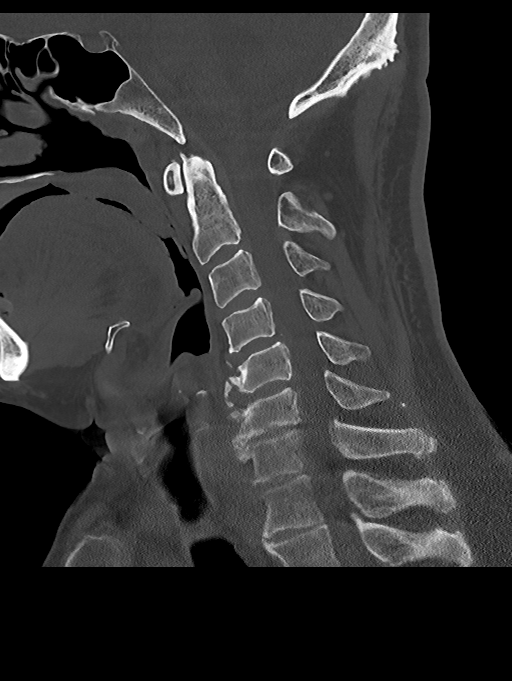
[im 31/61  soft-tissue]
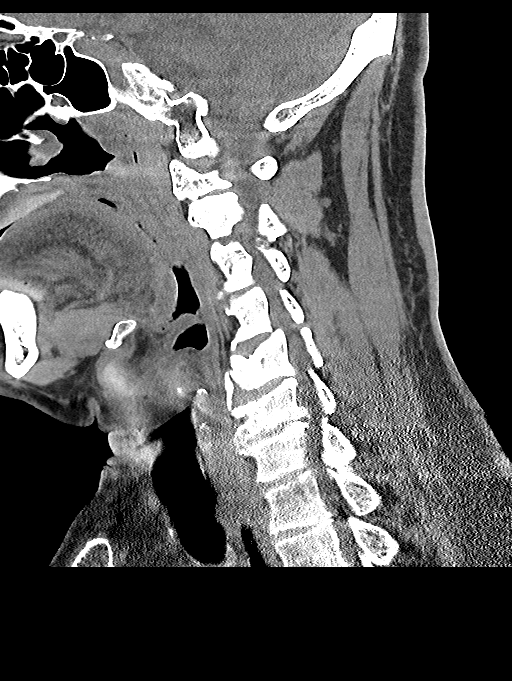
[im 31/61  bone]
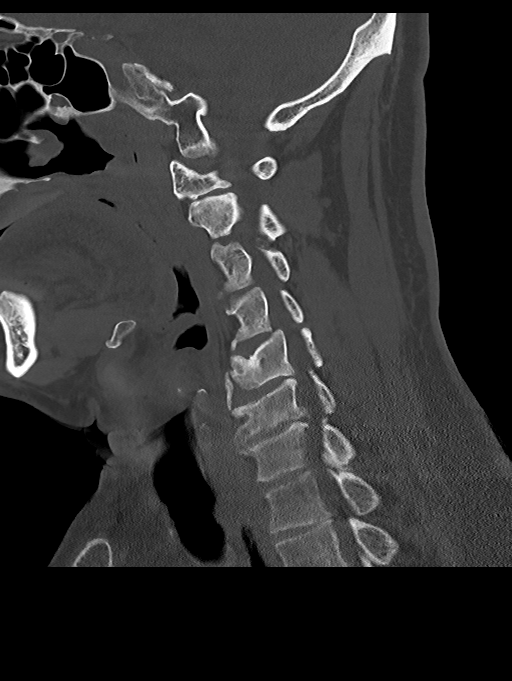
[im 36/61  bone]
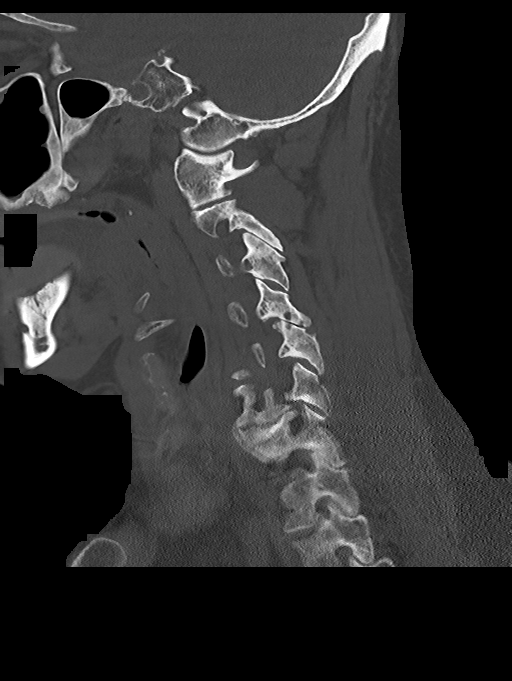
[im 41/61  bone]
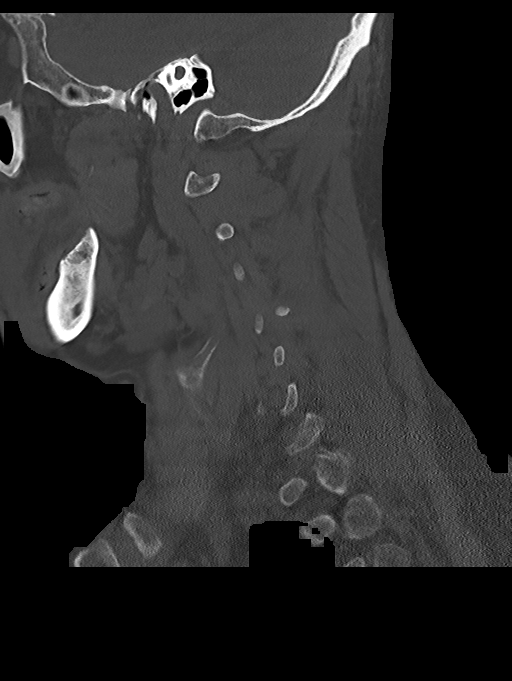

[Series 7: c_spine 2.0 cor bone · coronal · 0.37mm/px · 3 of 76 slices shown]
[im 16/76  bone]
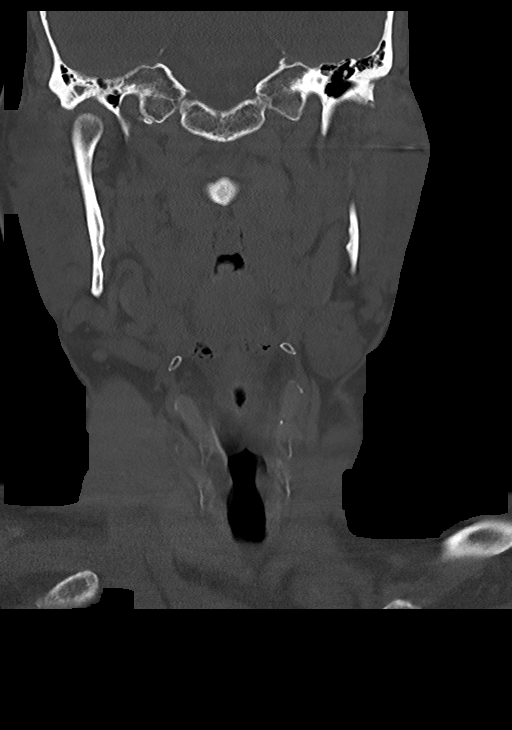
[im 31/76  bone]
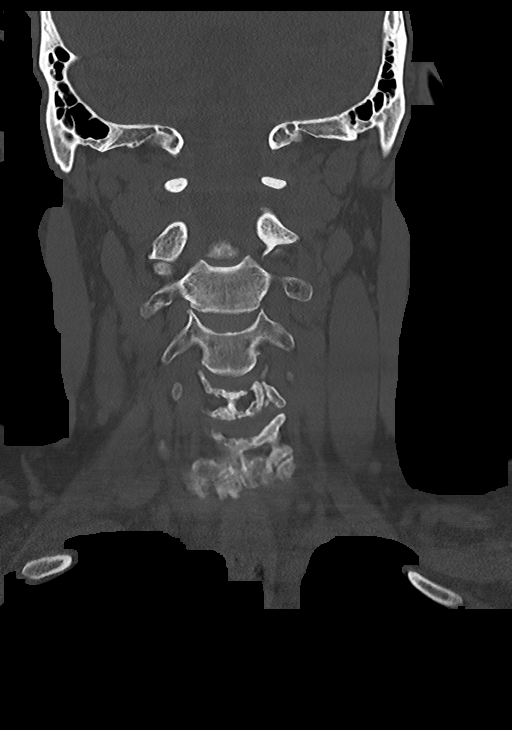
[im 46/76  bone]
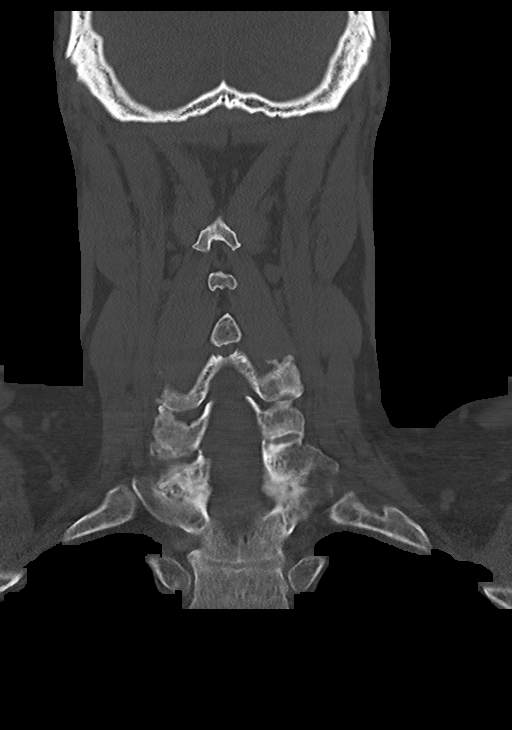

[12 of 33 positions shown; findings below may reference images not displayed]

FINDINGS: CT HEAD FINDINGS

Brain: Generalized atrophy. Normal ventricular morphology. No
midline shift or mass effect. Small vessel chronic ischemic changes
of deep cerebral white matter. No intracranial hemorrhage, mass
lesion, evidence of acute infarction, or extra-axial fluid
collection. Scattered dural calcifications within falx.

Vascular: Unremarkable

Skull: Calvaria intact. Small RIGHT frontal scalp laceration with
foci of subcutaneous gas.

Sinuses/Orbits: Clear

Other: N/A

CT CERVICAL SPINE FINDINGS

Alignment: Normal

Skull base and vertebrae: Disc space narrowing with endplate spur
formation at C5-C6 through T1-T2. Additional tiny endplate spurs at
C4-C5 anteriorly. Uncovertebral spurs encroach upon C6-C7 neural
foramina bilaterally, with similar but less severe findings at T1-T2
and LEFT C5-C6. Scattered multilevel facet degenerative changes.
Vertebral body heights maintained. No fracture, subluxation or bone
destruction. Previously identified odontoid fracture appears healed.
Visualized skull base intact.

Soft tissues and spinal canal: Prevertebral soft tissues normal
thickness. Cervical soft tissues otherwise unremarkable.

Disc levels:  No additional abnormalities

Upper chest: Lung apices clear

Other: N/A
IMPRESSION: Atrophy with small vessel chronic ischemic changes of deep cerebral
white matter.

No acute intracranial abnormalities.

Degenerative disc and facet disease changes of cervical spine.

Interval healing of prior odontoid fracture.

No acute cervical spine abnormalities.
